# Patient Record
Sex: Male | Born: 1995 | Race: White | Hispanic: No | Marital: Single | Smoking: Current some day smoker
Health system: Southern US, Community
[De-identification: ages and names within clinical notes are randomized; demographics above are authoritative.]

## PROBLEM LIST (undated history)

## (undated) DIAGNOSIS — K219 Gastro-esophageal reflux disease without esophagitis: Secondary | ICD-10-CM

## (undated) HISTORY — DX: Gastro-esophageal reflux disease without esophagitis: K21.9

## (undated) HISTORY — PX: NO PAST SURGERIES: SHX2092

---
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
## 2016-01-06 ENCOUNTER — Telehealth: Payer: Self-pay | Admitting: Gastroenterology

## 2016-01-06 NOTE — Telephone Encounter (Signed)
Attempted to call patient and schedule appointment with Dr. Servando SnareWohl for chronic reflux. Referred by [facility]. Home phone disconnected and cell can't leave messages. I called [facility] to let them know.

## 2016-01-07 ENCOUNTER — Telehealth: Payer: Self-pay | Admitting: Gastroenterology

## 2016-01-07 NOTE — Telephone Encounter (Signed)
Left message with patients dad for him to call and schedule appointment with Dr. Servando SnareWohl for chronic reflux. Referred by [facility]

## 2016-02-12 DIAGNOSIS — K219 Gastro-esophageal reflux disease without esophagitis: Secondary | ICD-10-CM | POA: Insufficient documentation

## 2016-02-12 HISTORY — DX: Gastro-esophageal reflux disease without esophagitis: K21.9

## 2016-02-16 ENCOUNTER — Encounter: Payer: Self-pay | Admitting: Gastroenterology

## 2016-02-16 ENCOUNTER — Ambulatory Visit (INDEPENDENT_AMBULATORY_CARE_PROVIDER_SITE_OTHER): Payer: 59 | Admitting: Gastroenterology

## 2016-02-16 VITALS — BP 98/60 | HR 62 | Temp 98.1°F | Ht 67.0 in | Wt 128.4 lb

## 2016-02-16 DIAGNOSIS — K219 Gastro-esophageal reflux disease without esophagitis: Secondary | ICD-10-CM | POA: Diagnosis not present

## 2016-02-16 NOTE — Progress Notes (Signed)
Gastroenterology Consultation  Referring Provider:     No ref. provider found Primary Care Physician:  No primary care provider on file. Primary Gastroenterologist:  Dr. Servando Snare     Reason for Consultation:     Heartburn        HPI:   Michael Vargas is a 20 y.o. y/o male referred for consultation & management of Heartburn by Dr. Bonnetta Barry primary care provider on file..  This patient comes in today with a history of heartburn. The patient was started on omeprazole 40 mg which she takes at night for his heartburn and states his heartburn has been under control. The patient denies any dysphagia or unexplained weight loss. The patient also reports that he has been doing much better since he started eating better. The patient comes with his mother today. There is no report of any black stools or bloody stools. He also denies any acid breakthrough. The patient was seen by ENT who suggested he come to see me.  Past Medical History:  Diagnosis Date  . GERD (gastroesophageal reflux disease) 02/12/2016    Past Surgical History:  Procedure Laterality Date  . NO PAST SURGERIES      Prior to Admission medications   Medication Sig Start Date End Date Taking? Authorizing Provider  omeprazole (PRILOSEC) 40 MG capsule  01/27/16  Yes Historical Provider, MD    History reviewed. No pertinent family history.   Social History  Substance Use Topics  . Smoking status: Current Some Day Smoker  . Smokeless tobacco: Current User  . Alcohol use Yes     Comment: Occasional    Allergies as of 02/16/2016  . (No Known Allergies)    Review of Systems:    All systems reviewed and negative except where noted in HPI.   Physical Exam:  BP 98/60   Pulse 62   Temp 98.1 F (36.7 C) (Oral)   Ht 5\' 7"  (1.702 m)   Wt 128 lb 6.4 oz (58.2 kg)   BMI 20.11 kg/m  No LMP for male patient. Psych:  Alert and cooperative. Normal mood and affect. General:   Alert,  Well-developed, well-nourished, pleasant and  cooperative in NAD Head:  Normocephalic and atraumatic. Eyes:  Sclera clear, no icterus.   Conjunctiva pink. Ears:  Normal auditory acuity. Nose:  No deformity, discharge, or lesions. Mouth:  No deformity or lesions,oropharynx pink & moist. Neck:  Supple; no masses or thyromegaly. Lungs:  Respirations even and unlabored.  Clear throughout to auscultation.   No wheezes, crackles, or rhonchi. No acute distress. Heart:  Regular rate and rhythm; no murmurs, clicks, rubs, or gallops. Abdomen:  Normal bowel sounds.  No bruits.  Soft, non-tender and non-distended without masses, hepatosplenomegaly or hernias noted.  No guarding or rebound tenderness.  Negative Carnett sign.   Rectal:  Deferred.  Msk:  Symmetrical without gross deformities.  Good, equal movement & strength bilaterally. Pulses:  Normal pulses noted. Extremities:  No clubbing or edema.  No cyanosis. Neurologic:  Alert and oriented x3;  grossly normal neurologically. Skin:  Intact without significant lesions or rashes.  No jaundice. Lymph Nodes:  No significant cervical adenopathy. Psych:  Alert and cooperative. Normal mood and affect.  Imaging Studies: No results found.  Assessment and Plan:   Michael Vargas is a 20 y.o. y/o male who has a history of heartburn that he states was worse when he was taking his PPI intermittently. The patient is on 40 mg of omeprazole. The patient  has been told that he can try to decrease the omeprazole to 20 mg a day he has also been told that he can consider a Nissen's fundoplication for his reflux. The patient was also told the risks of PPIs including decreased magnesium, iron, calcium absorption and the increased risk of C. difficile colitis. The patient has also been told about the increased risk of renal failure. The patient states he would like to stay on the medication and he will contact me if any symptoms change.   Note: This dictation was prepared with Dragon dictation along with smaller  phrase technology. Any transcriptional errors that result from this process are unintentional.

## 2016-02-16 NOTE — Patient Instructions (Signed)
Continue current medication and contact us if symptoms return.

## 2016-05-31 NOTE — Telephone Encounter (Signed)
error 

## 2016-06-07 ENCOUNTER — Encounter: Payer: Self-pay | Admitting: Gastroenterology

## 2016-06-07 ENCOUNTER — Ambulatory Visit (INDEPENDENT_AMBULATORY_CARE_PROVIDER_SITE_OTHER): Payer: 59 | Admitting: Gastroenterology

## 2016-06-07 VITALS — BP 126/65 | HR 65 | Temp 98.1°F | Ht 67.0 in | Wt 132.0 lb

## 2016-06-07 DIAGNOSIS — K219 Gastro-esophageal reflux disease without esophagitis: Secondary | ICD-10-CM

## 2016-06-07 NOTE — Progress Notes (Signed)
   Primary Care Physician: No primary care provider on file.  Primary Gastroenterologist:  Dr. Midge Miniumarren Suzette Flagler  Chief Complaint  Patient presents with  . Gastroesophageal Reflux     Discuss possible EGD    HPI: Michael RochColby D Farooqui is a 21 y.o. male here for follow-up of his heartburn.  The patient reports that his medication has kept his heartburn under control but when he went out and had take-out chicken he put on some spicy sauce that caused him to have symptoms.  The patient is here with his mother today with questions about whether having antireflux surgery would allow him to eat anything he wants and not have symptoms. The patient reports that he is doing well unless he stops watching what he eats.  Current Outpatient Prescriptions  Medication Sig Dispense Refill  . omeprazole (PRILOSEC) 40 MG capsule      No current facility-administered medications for this visit.     Allergies as of 06/07/2016  . (No Known Allergies)    ROS:  General: Negative for anorexia, weight loss, fever, chills, fatigue, weakness. ENT: Negative for hoarseness, difficulty swallowing , nasal congestion. CV: Negative for chest pain, angina, palpitations, dyspnea on exertion, peripheral edema.  Respiratory: Negative for dyspnea at rest, dyspnea on exertion, cough, sputum, wheezing.  GI: See history of present illness. GU:  Negative for dysuria, hematuria, urinary incontinence, urinary frequency, nocturnal urination.  Endo: Negative for unusual weight change.    Physical Examination:   BP 126/65   Pulse 65   Temp 98.1 F (36.7 C) (Oral)   Ht 5\' 7"  (1.702 m)   Wt 132 lb (59.9 kg)   BMI 20.67 kg/m   General: Well-nourished, well-developed in no acute distress.  Eyes: No icterus. Conjunctivae pink. Extremities: No lower extremity edema. No clubbing or deformities. Neuro: Alert and oriented x 3.  Grossly intact. Skin: Warm and dry, no jaundice.   Psych: Alert and cooperative, normal mood and  affect.  Labs:    Imaging Studies: No results found.  Assessment and Plan:   Michael Vargas is a 21 y.o. y/o male who comes in today with further questions about his reflux.  The patient states that he is still having acid reflux breakthrough when he eats things that he knows he should not eat like spicy foods and chocolate before he goes to bed.  The patient has been informed that the surgery is not typically permanent and will not solve all of his problems.  He has been offered a trial of Dexilant to see if this helps his symptoms better than his omeprazole.  The patient will think about his options and contact me if he decides that he wants to go through with antireflux surgery.  At that time the patient may need an upper endoscopy and manometry with pH studying prior to any surgery.  The patient and his mother have explained the plan and agree with it.    Midge Miniumarren Nissim Fleischer, MD. Clementeen GrahamFACG   Note: This dictation was prepared with Dragon dictation along with smaller phrase technology. Any transcriptional errors that result from this process are unintentional.

## 2016-06-09 ENCOUNTER — Telehealth: Payer: Self-pay | Admitting: Gastroenterology

## 2016-06-09 NOTE — Telephone Encounter (Signed)
Please call Michael Vargas patients mom to schedule an EGD

## 2016-06-09 NOTE — Telephone Encounter (Signed)
Patients mother called back and disregard this message. They are going to give it some time and see if the medication will work

## 2016-06-09 NOTE — Telephone Encounter (Signed)
Noted! Thank you

## 2016-06-16 ENCOUNTER — Other Ambulatory Visit: Payer: Self-pay

## 2016-06-16 MED ORDER — DEXLANSOPRAZOLE 60 MG PO CPDR: 60.0000 mg | DELAYED_RELEASE_CAPSULE | Freq: Every day | ORAL | 6 refills | Status: AC
